# Patient Record
Sex: Female | Born: 1965 | Race: White | Hispanic: No | Marital: Married | Smoking: Former smoker
Health system: Southern US, Community
[De-identification: ages and names within clinical notes are randomized; demographics above are authoritative.]

## PROBLEM LIST (undated history)

## (undated) DIAGNOSIS — J45909 Unspecified asthma, uncomplicated: Secondary | ICD-10-CM

## (undated) DIAGNOSIS — I10 Essential (primary) hypertension: Secondary | ICD-10-CM

## (undated) HISTORY — PX: ABDOMINAL HYSTERECTOMY: SHX81

## (undated) HISTORY — PX: TUBAL LIGATION: SHX77

---

## 2016-05-09 ENCOUNTER — Other Ambulatory Visit: Payer: Self-pay | Admitting: Plastic Surgery

## 2016-05-10 ENCOUNTER — Other Ambulatory Visit (HOSPITAL_COMMUNITY): Payer: Self-pay | Admitting: Plastic Surgery

## 2016-05-10 DIAGNOSIS — S301XXA Contusion of abdominal wall, initial encounter: Secondary | ICD-10-CM

## 2016-05-10 DIAGNOSIS — S3011XA Contusion of abdominal wall, initial encounter: Secondary | ICD-10-CM

## 2016-05-11 ENCOUNTER — Other Ambulatory Visit: Payer: Self-pay | Admitting: Radiology

## 2016-05-12 ENCOUNTER — Ambulatory Visit (HOSPITAL_COMMUNITY)
Admission: RE | Admit: 2016-05-12 | Discharge: 2016-05-12 | Disposition: A | Discharge status: Home / Self Care | Payer: 59 | Source: Ambulatory Visit | Attending: Plastic Surgery | Admitting: Plastic Surgery

## 2016-05-12 ENCOUNTER — Other Ambulatory Visit (HOSPITAL_COMMUNITY): Payer: Self-pay | Admitting: Plastic Surgery

## 2016-05-12 ENCOUNTER — Encounter (HOSPITAL_COMMUNITY): Payer: Self-pay

## 2016-05-12 DIAGNOSIS — I10 Essential (primary) hypertension: Secondary | ICD-10-CM | POA: Diagnosis not present

## 2016-05-12 DIAGNOSIS — Z8249 Family history of ischemic heart disease and other diseases of the circulatory system: Secondary | ICD-10-CM | POA: Insufficient documentation

## 2016-05-12 DIAGNOSIS — S301XXA Contusion of abdominal wall, initial encounter: Secondary | ICD-10-CM

## 2016-05-12 DIAGNOSIS — Z87891 Personal history of nicotine dependence: Secondary | ICD-10-CM | POA: Diagnosis not present

## 2016-05-12 DIAGNOSIS — J45909 Unspecified asthma, uncomplicated: Secondary | ICD-10-CM | POA: Diagnosis not present

## 2016-05-12 DIAGNOSIS — L7634 Postprocedural seroma of skin and subcutaneous tissue following other procedure: Secondary | ICD-10-CM | POA: Insufficient documentation

## 2016-05-12 DIAGNOSIS — Y813 Surgical instruments, materials and general- and plastic-surgery devices (including sutures) associated with adverse incidents: Secondary | ICD-10-CM | POA: Diagnosis not present

## 2016-05-12 DIAGNOSIS — Y838 Other surgical procedures as the cause of abnormal reaction of the patient, or of later complication, without mention of misadventure at the time of the procedure: Secondary | ICD-10-CM | POA: Diagnosis not present

## 2016-05-12 HISTORY — DX: Essential (primary) hypertension: I10

## 2016-05-12 HISTORY — DX: Unspecified asthma, uncomplicated: J45.909

## 2016-05-12 LAB — CBC
HCT: 37.4 % (ref 36.0–46.0)
HEMOGLOBIN: 12 g/dL (ref 12.0–15.0)
MCH: 31.3 pg (ref 26.0–34.0)
MCHC: 32.1 g/dL (ref 30.0–36.0)
MCV: 97.4 fL (ref 78.0–100.0)
PLATELETS: 317 10*3/uL (ref 150–400)
RBC: 3.84 MIL/uL — ABNORMAL LOW (ref 3.87–5.11)
RDW: 13.2 % (ref 11.5–15.5)
WBC: 6.8 10*3/uL (ref 4.0–10.5)

## 2016-05-12 LAB — APTT: APTT: 29 s (ref 24–36)

## 2016-05-12 LAB — PROTIME-INR
INR: 0.99
PROTHROMBIN TIME: 13.1 s (ref 11.4–15.2)

## 2016-05-12 MED ORDER — LIDOCAINE HCL 1 % IJ SOLN
INTRAMUSCULAR | Status: AC
  Filled 2016-05-12: qty 20

## 2016-05-12 MED ORDER — FENTANYL CITRATE (PF) 100 MCG/2ML IJ SOLN
INTRAMUSCULAR | Status: AC
  Filled 2016-05-12: qty 2

## 2016-05-12 MED ORDER — MIDAZOLAM HCL 2 MG/2ML IJ SOLN
INTRAMUSCULAR | Status: AC
  Filled 2016-05-12: qty 2

## 2016-05-12 MED ORDER — MIDAZOLAM HCL 2 MG/2ML IJ SOLN
INTRAMUSCULAR | Status: AC | PRN
  Administered 2016-05-12: 1 mg via INTRAVENOUS

## 2016-05-12 MED ORDER — FENTANYL CITRATE (PF) 100 MCG/2ML IJ SOLN
INTRAMUSCULAR | Status: AC | PRN
  Administered 2016-05-12: 50 ug via INTRAVENOUS

## 2016-05-12 MED ORDER — CEFAZOLIN SODIUM-DEXTROSE 2-4 GM/100ML-% IV SOLN
2.0000 g | Freq: Once | INTRAVENOUS | Status: AC
  Administered 2016-05-12: 2 g via INTRAVENOUS
  Filled 2016-05-12: qty 100

## 2016-05-12 MED ORDER — CEFAZOLIN SODIUM-DEXTROSE 2-4 GM/100ML-% IV SOLN
INTRAVENOUS | Status: AC
  Filled 2016-05-12: qty 100

## 2016-05-12 MED ORDER — SODIUM CHLORIDE 0.9 % IV SOLN: INTRAVENOUS | Status: DC

## 2016-05-12 NOTE — Sedation Documentation (Signed)
Doctor at bedside explaining the delay to patient and family member at bedside.

## 2016-05-12 NOTE — Sedation Documentation (Signed)
Patient denies pain and is resting comfortably.  

## 2016-05-12 NOTE — H&P (Signed)
Chief Complaint: abdominal wall seroma  Referring Physician:Dr. Brantley Persons  Supervising Physician: Jolaine Click  Patient Status: [facility] - Out-pt  HPI: Tracy Saunders is an 51 y.o. female who underwent an abdominoplasty by the above referring physician 6 weeks ago.  She had 2 JP drains left in place.  They were removed about 2 weeks ago.  However, the patient states she has been developing seromas every since.  She states she is coming to the office every other day for aspirations.  Apparently, the physician then decided to request sclerotherapy for this seroma.  The patient presents today for this procedure.  Past Medical History:  Past Medical History:  Diagnosis Date  . Asthma   . Hypertension     Past Surgical History:  Past Surgical History:  Procedure Laterality Date  . ABDOMINAL HYSTERECTOMY    . TUBAL LIGATION      Family History:  Family History  Problem Relation Age of Onset  . Heart disease Mother   . Diabetes Father   . Alzheimer's disease Father   . Heart disease Father   . Breast cancer Sister     Social History:  reports that she quit smoking about 30 years ago. Her smoking use included Cigarettes. She has a 5.00 pack-year smoking history. She has never used smokeless tobacco. She reports that she drinks alcohol. She reports that she does not use drugs.  Allergies:  Allergies  Allergen Reactions  . Allegra Allergy [Fexofenadine Hcl] Shortness Of Breath  . Nifedipine Shortness Of Breath  . Lisinopril Cough    Medications: Medications reviewed in epic  Please HPI for pertinent positives, otherwise complete 10 system ROS negative.  Mallampati Score: MD Evaluation Airway: WNL Heart: WNL Abdomen: Other (comments) Abdomen comments: large transverse abdominal incision, healing well from recent abdominoplasty Chest/ Lungs: WNL ASA  Classification: 2 Mallampati/Airway Score: Two  Physical Exam: BP 126/79   Pulse 64   Temp 97.9 F (36.6 C)  (Oral)   Resp 16   Ht 5\' 8"  (1.727 m)   Wt 170 lb (77.1 kg)   SpO2 100%   BMI 25.85 kg/m  Body mass index is 25.85 kg/m. General: pleasant, WD, WN white female who is laying in bed in NAD HEENT: head is normocephalic, atraumatic.  Sclera are noninjected.  PERRL.  Ears and nose without any masses or lesions.  Mouth is pink and moist Heart: regular, rate, and rhythm.  Normal s1,s2. No obvious murmurs, gallops, or rubs noted.  Palpable radial and pedal pulses bilaterally Lungs: CTAB, no wheezes, rhonchi, or rales noted.  Respiratory effort nonlabored Abd: soft, NT, ND, +BS, no masses.  Transverse abdominal scar from abdominoplasty noted.  Some induration noted all along the scar, not definitely fluctuants noted.  No evidence of erythema or infection. Psych: A&Ox3 with an appropriate affect.   Labs: Results for orders placed or performed during the hospital encounter of 05/12/16 (from the past 48 hour(s))  APTT upon arrival     Status: None   Collection Time: 05/12/16  7:42 AM  Result Value Ref Range   aPTT 29 24 - 36 seconds  CBC upon arrival     Status: Abnormal   Collection Time: 05/12/16  7:42 AM  Result Value Ref Range   WBC 6.8 4.0 - 10.5 K/uL   RBC 3.84 (L) 3.87 - 5.11 MIL/uL   Hemoglobin 12.0 12.0 - 15.0 g/dL   HCT 16.1 09.6 - 04.5 %   MCV 97.4 78.0 - 100.0 fL  MCH 31.3 26.0 - 34.0 pg   MCHC 32.1 30.0 - 36.0 g/dL   RDW 96.013.2 45.411.5 - 09.815.5 %   Platelets 317 150 - 400 K/uL  Protime-INR upon arrival     Status: None   Collection Time: 05/12/16  7:42 AM  Result Value Ref Range   Prothrombin Time 13.1 11.4 - 15.2 seconds   INR 0.99     Imaging: No results found.  Assessment/Plan 1. S/p abdominoplasty with recurrent abdominal wall seroma -unfortunately, there is no imaging for the patient and protocol for this situation prior to sclerotherapy, due to risks, is to proceed first with imaging and possible drain placement and management for at least a week.  If this fails, then  consideration for sclerotherapy is appropriate. -there was initially a call to set up a clinic visit to discuss this prior to today but that was refused.  So now today, Dr. Bonnielee HaffHoss is going to try and contact the ordering provider to discuss a plan, which will likely be a CT scan and if the fluid collection is large enough, then he will place a drain. -cementing of the plan will be made after his discussion with Dr. Sherald Hessontogiannis.  Thank you for this interesting consult.  I greatly enjoyed meeting Tracy Saunders and look forward to participating in their care.  A copy of this report was sent to the requesting provider on this date.  Electronically Signed: Letha CapeSBORNE,Murlene Revell E 05/12/2016, 9:05 AM   I spent a total of  40 Minutes   in face to face in clinical consultation, greater than 50% of which was counseling/coordinating care for abdominal wall seroma

## 2016-05-12 NOTE — Sedation Documentation (Signed)
Doctor at bedside talking to patient and family member regarding delay.

## 2016-05-12 NOTE — Sedation Documentation (Signed)
In CT waiting for Doctor.

## 2016-05-12 NOTE — Procedures (Signed)
10 Fr abd seroma drain No comp/EBL

## 2016-05-12 NOTE — Sedation Documentation (Signed)
Patient is resting comfortably. 

## 2016-05-12 NOTE — Discharge Instructions (Signed)
Surgical [facility] Introduction Surgical drains are used to remove extra fluid that normally builds up in a surgical wound after surgery. A surgical drain helps to heal a surgical wound. Different kinds of surgical drains include:  Active drains. These drains use suction to pull drainage away from the surgical wound. Drainage flows through a tube to a container outside of the body. It is important to keep the bulb or the drainage container flat (compressed) at all times, except while you empty it. Flattening the bulb or container creates suction. The two most common types of active drains are bulb drains and Hemovac drains.  Passive drains. These drains allow fluid to drain naturally, by gravity. Drainage flows through a tube to a bandage (dressing) or a container outside of the body. Passive drains do not need to be emptied. The most common type of passive drain is the Penrose drain. A drain is placed during surgery. Immediately after surgery, drainage is usually bright red and a little thicker than water. The drainage may gradually turn yellow or pink and become thinner. It is likely that your health care provider will remove the drain when the drainage stops or when the amount decreases to 1-2 Tbsp (15-30 mL) during a 24-hour period. How to care for your surgical drain  Keep the skin around the drain dry and covered with a dressing at all times.  Check your drain area every day for signs of infection. Check for:  More redness, swelling, or pain.  Pus or a bad smell.  Cloudy drainage. Follow instructions from your health care provider about how to take care of your drain and how to change your dressing. Change your dressing at least one time every day. Change it more often if needed to keep the dressing dry. Make sure you: 1. Gather your supplies, including:  Tape.  Germ-free cleaning solution (sterile saline).  Split gauze drain sponge: 4 x 4 inches (10 x 10 cm).  Gauze square: 4  x 4 inches (10 x 10 cm). 2. Wash your hands with soap and water before you change your dressing. If soap and water are not available, use hand sanitizer. 3. Remove the old dressing. Avoid using scissors to do that. 4. Use sterile saline to clean your skin around the drain. 5. Place the tube through the slit in a drain sponge. Place the drain sponge so that it covers your wound. 6. Place the gauze square or another drain sponge on top of the drain sponge that is on the wound. Make sure the tube is between those layers. 7. Tape the dressing to your skin. 8. If you have an active bulb or Hemovac drain, tape the drainage tube to your skin 1-2 inches (2.5-5 cm) below the place where the tube enters your body. Taping keeps the tube from pulling on any stitches (sutures) that you have. 9. Wash your hands with soap and water. 10. Write down the color of your drainage and how often you change your dressing. How to empty your active bulb or Hemovac drain 1. Make sure that you have a measuring cup that you can empty your drainage into. 2. Wash your hands with soap and water. If soap and water are not available, use hand sanitizer. 3. Gently move your fingers down the tube while squeezing very lightly. This is called stripping the tube. This clears any drainage, clots, or tissue from the tube.  Do not pull on the tube.  You may need to strip the  tube several times every day to keep the tube clear. 4. Open the bulb cap or the drain plug. Do not touch the inside of the cap or the bottom of the plug. 5. Empty all of the drainage into the measuring cup. 6. Compress the bulb or the container and replace the cap or the plug. To compress the bulb or the container, squeeze it firmly in the middle while you close the cap or plug the container. 7. Write down the amount of drainage that you have in each 24-hour period. If you have less than 2 Tbsp (30 mL) of drainage during 24 hours, contact your health care  provider. 8. Flush the drainage down the toilet. 9. Wash your hands with soap and water. Contact a health care provider if:  You have more redness, swelling, or pain around your drain area.  The amount of drainage that you have is increasing instead of decreasing.  You have pus or a bad smell coming from your drain area.  You have a fever.  You have drainage that is cloudy.  There is a sudden stop or a sudden decrease in the amount of drainage that you have.  Your tube falls out.  Your active draindoes not stay compressedafter you empty it. This information is not intended to replace advice given to you by your health care provider. Make sure you discuss any questions you have with your health care provider. Document Released: 03/10/2000 Document Revised: 08/19/2015 Document Reviewed: 09/30/2014  2017 Elsevier

## 2016-05-12 NOTE — Sedation Documentation (Signed)
Doctor arrived in CT

## 2016-05-15 ENCOUNTER — Other Ambulatory Visit: Payer: 59

## 2016-05-15 ENCOUNTER — Other Ambulatory Visit (HOSPITAL_COMMUNITY): Payer: Self-pay | Admitting: Interventional Radiology

## 2016-05-15 DIAGNOSIS — S301XXA Contusion of abdominal wall, initial encounter: Secondary | ICD-10-CM

## 2016-05-17 LAB — AEROBIC/ANAEROBIC CULTURE (SURGICAL/DEEP WOUND)

## 2016-05-17 LAB — AEROBIC/ANAEROBIC CULTURE W GRAM STAIN (SURGICAL/DEEP WOUND): Culture: NO GROWTH

## 2016-05-18 ENCOUNTER — Encounter: Payer: Self-pay | Admitting: Radiology

## 2016-05-18 ENCOUNTER — Other Ambulatory Visit (HOSPITAL_COMMUNITY): Payer: Self-pay | Admitting: Interventional Radiology

## 2016-05-18 ENCOUNTER — Other Ambulatory Visit: Payer: Self-pay | Admitting: Radiology

## 2016-05-18 ENCOUNTER — Ambulatory Visit
Admission: RE | Admit: 2016-05-18 | Discharge: 2016-05-18 | Disposition: A | Discharge status: Home / Self Care | Payer: 59 | Source: Ambulatory Visit | Attending: Interventional Radiology | Admitting: Interventional Radiology

## 2016-05-18 DIAGNOSIS — S301XXA Contusion of abdominal wall, initial encounter: Secondary | ICD-10-CM

## 2016-05-18 HISTORY — PX: IR GENERIC HISTORICAL: IMG1180011

## 2016-05-18 NOTE — Progress Notes (Signed)
Chief Complaint: Patient was seen in consultation today for  Chief Complaint  Patient presents with  . Follow-up    Follow up seroma drain   at the request of Keshana Klemz  Referring Physician(s): Farra Nikolic  History of Present Illness: Tracy Saunders is a 51 y.o. female who has an abdominal wall seroma after abdominoplasty. She underwent percutaneous drainage and is in follow-up for reevaluation. She has been recording the outputs at 30 mL per day. She has not been flushing the drain at all. Output is clear and serous. She denies fevers or chills. She has continued to wear her binder.  Past Medical History:  Diagnosis Date  . Asthma   . Hypertension     Past Surgical History:  Procedure Laterality Date  . ABDOMINAL HYSTERECTOMY    . TUBAL LIGATION      Allergies: Allegra allergy [fexofenadine hcl]; Nifedipine; and Lisinopril  Medications: Prior to Admission medications   Medication Sig Start Date End Date Taking? Authorizing Provider  cetirizine (ZYRTEC) 10 MG tablet Take 10 mg by mouth 2 (two) times daily.    Historical Provider, MD  citalopram (CELEXA) 20 MG tablet Take 20 mg by mouth 2 (two) times daily at 10 AM and 5 PM.    Historical Provider, MD  losartan (COZAAR) 50 MG tablet Take 50 mg by mouth daily.    Historical Provider, MD  montelukast (SINGULAIR) 10 MG tablet Take 10 mg by mouth at bedtime.    Historical Provider, MD     Family History  Problem Relation Age of Onset  . Heart disease Mother   . Diabetes Father   . Alzheimer's disease Father   . Heart disease Father   . Breast cancer Sister     Social History   Social History  . Marital status: Married    Spouse name: N/A  . Number of children: N/A  . Years of education: N/A   Social History Main Topics  . Smoking status: Former Smoker    Packs/day: 0.50    Years: 10.00    Types: Cigarettes    Quit date: 1988  . Smokeless tobacco: Never Used  . Alcohol use Yes     Comment: social  .  Drug use: No  . Sexual activity: Not on file   Other Topics Concern  . Not on file   Social History Narrative  . No narrative on file      Review of Systems: A 12 point ROS discussed and pertinent positives are indicated in the HPI above.  All other systems are negative.  Review of Systems  Vital Signs: BP 129/85 (BP Location: Left Arm, Patient Position: Sitting, Cuff Size: Normal)   Pulse 69   Temp 98 F (36.7 C) (Oral)   Resp 14   Ht 5\' 8"  (1.727 m)   Wt 170 lb (77.1 kg)   SpO2 100%   BMI 25.85 kg/m   Physical Exam  Constitutional: She is oriented to person, place, and time.  HENT:  Head: Normocephalic and atraumatic.  Abdominal:  The lower abdominal drain site is clean and dry. The JP bulb contains 30 mL  Musculoskeletal: Normal range of motion.  Neurological: She is alert and oriented to person, place, and time.  Skin: Skin is warm and dry.    Mallampati Score:     Imaging: Ct Image Guided Drainage Percut Cath  Peritoneal Retroperit  Result Date: 05/12/2016 INDICATION: Anterior abdominal wall seroma. This has been aspirated innumerable times by the plastic  Careers adviser. Drainage and possible sclerotherapy is requested. EXAM: CT CORE BIOPSY RENAL MEDICATIONS: The patient is currently admitted to the hospital and receiving intravenous antibiotics. The antibiotics were administered within an appropriate time frame prior to the initiation of the procedure. ANESTHESIA/SEDATION: Fentanyl 50 mcg IV; Versed 1 mg IV Moderate Sedation Time:  13 The patient was continuously monitored during the procedure by the interventional radiology nurse under my direct supervision. COMPLICATIONS: None immediate. PROCEDURE: Informed written consent was obtained from the patient after a thorough discussion of the procedural risks, benefits and alternatives. All questions were addressed. Maximal Sterile Barrier Technique was utilized including caps, mask, sterile gowns, sterile gloves, sterile  drape, hand hygiene and skin antiseptic. A timeout was performed prior to the initiation of the procedure. The right lower abdomen was prepped and draped in a sterile fashion. 1% lidocaine was utilized for local anesthesia. Ancef was given preoperatively. Under CT guidance, an 18 gauge needle was inserted into the seroma and removed over an Amplatz wire. A 10 French dilator followed by a 10 [place] drain were inserted. 30 cc serosanguineous fluid was aspirated. FINDINGS: Initial imaging demonstrates a very thin fluid collection between the anterior abdominal wall and subcutaneous fat of the lower abdomen below the umbilicus. Subsequent imaging demonstrates placement of a 10 French drain into the fluid collection. After drain placement, there was resolution of the fluid collection. It was sent for culture. IMPRESSION: The patient has a anterior abdominal wall seroma that has been aspirated multiple times. A drain was placed and will be removed next week. If the fluid collection does not recur, no further treatment is necessary. If the fluid collection recurrs, sclerotherapy can be considered. The patient will need follow-up next week for drain removal and a subsequent clinic visit in 2 subsequent weeks with ultrasound to evaluate for recurrence of the fluid collection. Electronically Signed   By: Jolaine Click M.D.   On: 05/12/2016 16:48    Labs:  CBC:  Recent Labs  05/12/16 0742  WBC 6.8  HGB 12.0  HCT 37.4  PLT 317    COAGS:  Recent Labs  05/12/16 0742  INR 0.99  APTT 29    BMP: No results for input(s): NA, K, CL, CO2, GLUCOSE, BUN, CALCIUM, CREATININE, GFRNONAA, GFRAA in the last 8760 hours.  Invalid input(s): CMP  LIVER FUNCTION TESTS: No results for input(s): BILITOT, AST, ALT, ALKPHOS, PROT, ALBUMIN in the last 8760 hours.  TUMOR MARKERS: No results for input(s): AFPTM, CEA, CA199, CHROMGRNA in the last 8760 hours.  Assessment and Plan:  After drainage of the seroma, serous  output has continued, therefore she has failed conservative management of the seroma. She will require alcohol sclerotherapy for definitive therapy. This will be scheduled ASAP. After sclerotherapy, the drain will remain in place and she will follow-up 1 week subsequently with [place] at the office. Sclerotherapy can be repeated until output diminishes  Thank you for this interesting consult.  I greatly enjoyed meeting Tracy Saunders and look forward to participating in their care.  A copy of this report was sent to the requesting provider on this date.  Electronically Signed: Kaycen Whitworth, ART A 05/18/2016, 11:41 AM   I spent a total of   10 Minutes in face to face in clinical consultation, greater than 50% of which was counseling/coordinating care for abdominal wall seroma and sclerotherapy. Patient ID: Tracy Saunders, female   DOB: 1965-07-16, 51 y.o.   MRN: [id number]

## 2016-05-19 ENCOUNTER — Encounter (HOSPITAL_COMMUNITY): Payer: Self-pay

## 2016-05-19 ENCOUNTER — Ambulatory Visit (HOSPITAL_COMMUNITY)
Admission: RE | Admit: 2016-05-19 | Discharge: 2016-05-19 | Disposition: A | Discharge status: Home / Self Care | Payer: 59 | Source: Ambulatory Visit | Attending: Interventional Radiology | Admitting: Interventional Radiology

## 2016-05-19 DIAGNOSIS — Z87891 Personal history of nicotine dependence: Secondary | ICD-10-CM | POA: Diagnosis not present

## 2016-05-19 DIAGNOSIS — Y839 Surgical procedure, unspecified as the cause of abnormal reaction of the patient, or of later complication, without mention of misadventure at the time of the procedure: Secondary | ICD-10-CM | POA: Insufficient documentation

## 2016-05-19 DIAGNOSIS — Z8249 Family history of ischemic heart disease and other diseases of the circulatory system: Secondary | ICD-10-CM | POA: Insufficient documentation

## 2016-05-19 DIAGNOSIS — J45909 Unspecified asthma, uncomplicated: Secondary | ICD-10-CM | POA: Diagnosis not present

## 2016-05-19 DIAGNOSIS — I10 Essential (primary) hypertension: Secondary | ICD-10-CM | POA: Diagnosis not present

## 2016-05-19 DIAGNOSIS — Y829 Unspecified medical devices associated with adverse incidents: Secondary | ICD-10-CM | POA: Diagnosis not present

## 2016-05-19 DIAGNOSIS — L7634 Postprocedural seroma of skin and subcutaneous tissue following other procedure: Secondary | ICD-10-CM | POA: Diagnosis present

## 2016-05-19 DIAGNOSIS — S301XXA Contusion of abdominal wall, initial encounter: Secondary | ICD-10-CM

## 2016-05-19 HISTORY — PX: IR GENERIC HISTORICAL: IMG1180011

## 2016-05-19 MED ORDER — ALCOHOL 98 % IV SOLN
30.0000 mL | Freq: Once | INTRAVENOUS | Status: DC
  Administered 2016-06-08: 20 mL
  Filled 2016-05-19: qty 30

## 2016-05-19 NOTE — H&P (Signed)
Chief Complaint: Patient was seen in consultation today for sclerotherapy of persistent abdominal seroma at the request of Dr Delcie RochM Contogiannis  Referring Physician(s): Dr Delcie RochM Contogiannis  Supervising Physician: Ruel FavorsShick, Trevor  [facility]  History of Present Illness: Tracy Saunders is a 51 y.o. female   Abdominal surgery Mar 31, 2016 Developed seroma that has been aspirated more than once. Most recently a drain was placed by Dr Bonnielee HaffHoss 2/16 Re seen in clinic 2/22 with Dr Bonnielee HaffHoss: After drainage of the seroma, serous output has continued, therefore she has failed conservative management of the seroma. She will require alcohol sclerotherapy for definitive therapy. This will be scheduled ASAP. After sclerotherapy, the drain will remain in place and she will follow-up 1 week subsequently with us at the office. Sclerotherapy can be repeated until output diminishes.  Now scheduled for procedure  Past Medical History:  Diagnosis Date  . Asthma   . Hypertension     Past Surgical History:  Procedure Laterality Date  . ABDOMINAL HYSTERECTOMY    . IR GENERIC HISTORICAL  05/18/2016   IR RADIOLOGIST EVAL & MGMT 05/18/2016 Jolaine ClickArthur Hoss, MD [facility] INTERV RAD  . TUBAL LIGATION      Allergies: Allegra allergy [fexofenadine hcl]; Nifedipine; Other; and Lisinopril  Medications: Prior to Admission medications   Medication Sig Start Date End Date Taking? Authorizing Provider  cetirizine (ZYRTEC) 10 MG tablet Take 10 mg by mouth 2 (two) times daily.   Yes Historical Provider, MD  citalopram (CELEXA) 20 MG tablet Take 20 mg by mouth 2 (two) times daily at 10 AM and 5 PM.   Yes Historical Provider, MD  losartan (COZAAR) 50 MG tablet Take 50 mg by mouth daily.   Yes Historical Provider, MD  montelukast (SINGULAIR) 10 MG tablet Take 10 mg by mouth at bedtime.   Yes Historical Provider, MD  Multiple Vitamin (MULTIVITAMIN WITH MINERALS) TABS tablet Take 1 tablet by mouth daily.   Yes  Historical Provider, MD     Family History  Problem Relation Age of Onset  . Heart disease Mother   . Diabetes Father   . Alzheimer's disease Father   . Heart disease Father   . Breast cancer Sister     Social History   Social History  . Marital status: Married    Spouse name: N/A  . Number of children: N/A  . Years of education: N/A   Social History Main Topics  . Smoking status: Former Smoker    Packs/day: 0.50    Years: 10.00    Types: Cigarettes    Quit date: 1988  . Smokeless tobacco: Never Used  . Alcohol use Yes     Comment: social  . Drug use: No  . Sexual activity: Not Asked   Other Topics Concern  . None   Social History Narrative  . None    Review of Systems: A 12 point ROS discussed and pertinent positives are indicated in the HPI above.  All other systems are negative.  Review of Systems  Constitutional: Negative for activity change, appetite change and fatigue.  Gastrointestinal: Positive for abdominal pain.  Psychiatric/Behavioral: Negative for behavioral problems and confusion.    Vital Signs: There were no vitals taken for this visit.  Physical Exam  Constitutional: She is oriented to person, place, and time. She appears well-nourished.  Cardiovascular: Normal rate, regular rhythm and normal heart sounds.   Pulmonary/Chest: Effort normal and breath sounds normal.  Abdominal: Soft. Bowel sounds are  normal.  Abdominal seroma Drain intact  Musculoskeletal: Normal range of motion.  Neurological: She is alert and oriented to person, place, and time.  Skin: Skin is warm and dry.  Psychiatric: She has a normal mood and affect. Her behavior is normal. Judgment and thought content normal.  Nursing note and vitals reviewed.   Mallampati Score:  MD Evaluation Airway: WNL Heart: WNL Abdomen: WNL Chest/ Lungs: WNL ASA  Classification: 2 Mallampati/Airway Score: One  Imaging: Ct Image Guided Drainage Percut Cath  Peritoneal  Retroperit  Result Date: 05/12/2016 INDICATION: Anterior abdominal wall seroma. This has been aspirated innumerable times by the plastic surgeon. Drainage and possible sclerotherapy is requested. EXAM: CT CORE BIOPSY RENAL MEDICATIONS: The patient is currently admitted to the hospital and receiving intravenous antibiotics. The antibiotics were administered within an appropriate time frame prior to the initiation of the procedure. ANESTHESIA/SEDATION: Fentanyl 50 mcg IV; Versed 1 mg IV Moderate Sedation Time:  13 The patient was continuously monitored during the procedure by the interventional radiology nurse under my direct supervision. COMPLICATIONS: None immediate. PROCEDURE: Informed written consent was obtained from the patient after a thorough discussion of the procedural risks, benefits and alternatives. All questions were addressed. Maximal Sterile Barrier Technique was utilized including caps, mask, sterile gowns, sterile gloves, sterile drape, hand hygiene and skin antiseptic. A timeout was performed prior to the initiation of the procedure. The right lower abdomen was prepped and draped in a sterile fashion. 1% lidocaine was utilized for local anesthesia. Ancef was given preoperatively. Under CT guidance, an 18 gauge needle was inserted into the seroma and removed over an Amplatz wire. A 10 French dilator followed by a 10 [place] drain were inserted. 30 cc serosanguineous fluid was aspirated. FINDINGS: Initial imaging demonstrates a very thin fluid collection between the anterior abdominal wall and subcutaneous fat of the lower abdomen below the umbilicus. Subsequent imaging demonstrates placement of a 10 French drain into the fluid collection. After drain placement, there was resolution of the fluid collection. It was sent for culture. IMPRESSION: The patient has a anterior abdominal wall seroma that has been aspirated multiple times. A drain was placed and will be removed next week. If the fluid  collection does not recur, no further treatment is necessary. If the fluid collection recurrs, sclerotherapy can be considered. The patient will need follow-up next week for drain removal and a subsequent clinic visit in 2 subsequent weeks with ultrasound to evaluate for recurrence of the fluid collection. Electronically Signed   By: Jolaine Click M.D.   On: 05/12/2016 16:48   Ir Radiologist Eval & Mgmt  Result Date: 05/18/2016 Please refer to "Notes" to see consult details.   Labs:  CBC:  Recent Labs  05/12/16 0742  WBC 6.8  HGB 12.0  HCT 37.4  PLT 317    COAGS:  Recent Labs  05/12/16 0742  INR 0.99  APTT 29    BMP: No results for input(s): NA, K, CL, CO2, GLUCOSE, BUN, CALCIUM, CREATININE, GFRNONAA, GFRAA in the last 8760 hours.  Invalid input(s): CMP  LIVER FUNCTION TESTS: No results for input(s): BILITOT, AST, ALT, ALKPHOS, PROT, ALBUMIN in the last 8760 hours.  TUMOR MARKERS: No results for input(s): AFPTM, CEA, CA199, CHROMGRNA in the last 8760 hours.  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  I greatly enjoyed meeting Jazmine Heckman and look forward to participating in their care.  A copy of this report was sent to the requesting provider on this date.  Electronically Signed: Ralene Muskrat A 05/19/2016, 11:12 AM   I spent a total of  30 Minutes   in face to face in clinical consultation, greater than 50% of which was counseling/coordinating care for sclerotherapy of abdominal seroma

## 2016-05-23 ENCOUNTER — Other Ambulatory Visit (HOSPITAL_COMMUNITY): Payer: Self-pay | Admitting: Interventional Radiology

## 2016-05-23 DIAGNOSIS — S301XXA Contusion of abdominal wall, initial encounter: Secondary | ICD-10-CM

## 2016-05-25 ENCOUNTER — Ambulatory Visit
Admission: RE | Admit: 2016-05-25 | Discharge: 2016-05-25 | Disposition: A | Discharge status: Home / Self Care | Payer: 59 | Source: Ambulatory Visit | Attending: Interventional Radiology | Admitting: Interventional Radiology

## 2016-05-25 DIAGNOSIS — S301XXA Contusion of abdominal wall, initial encounter: Secondary | ICD-10-CM

## 2016-05-25 NOTE — Progress Notes (Signed)
Chief Complaint: Persistent abdominal seroma  Referring Physician(s): Dr. Delcie RochM. Contogiannis  Supervising Physician: Ruel FavorsShick, Trevor  History of Present Illness: Tracy Saunders is a 51 y.o. female who underwent an abdominoplasty by Dr. Sherald Hessontogiannis on 03/31/2016.  Unfortunately she developed a seroma which was aspirated a couple of times and did not resolve.  She required drain placement by Dr. Bonnielee HaffHoss on 05/12/2016  She continued to have copious output from the drain so alcohol sclerotherapy was done on 05/19/2016 for definitive therapy.  She is here today for follow up.  The drainage has diminished.   Ultrasound shows no residual fluid accumulation.  Past Medical History:  Diagnosis Date  . Asthma   . Hypertension     Past Surgical History:  Procedure Laterality Date  . ABDOMINAL HYSTERECTOMY    . IR GENERIC HISTORICAL  05/18/2016   IR RADIOLOGIST EVAL & MGMT 05/18/2016 Jolaine ClickArthur Hoss, MD [facility] INTERV RAD  . IR GENERIC HISTORICAL  05/19/2016   IR SCLEROTHERAPY OF A FLUID COLLECTION 05/19/2016 Berdine DanceMichael Shick, MD [facility]INTER[facility] RAD  . TUBAL LIGATION      Allergies: Allegra allergy [fexofenadine hcl]; Nifedipine; Other; and Lisinopril  Medications: Prior to Admission medications   Medication Sig Start Date End Date Taking? Authorizing Provider  cetirizine (ZYRTEC) 10 MG tablet Take 10 mg by mouth 2 (two) times daily.    Historical Provider, MD  citalopram (CELEXA) 20 MG tablet Take 20 mg by mouth 2 (two) times daily at 10 AM and 5 PM.    Historical Provider, MD  losartan (COZAAR) 50 MG tablet Take 50 mg by mouth daily.    Historical Provider, MD  montelukast (SINGULAIR) 10 MG tablet Take 10 mg by mouth at bedtime.    Historical Provider, MD  Multiple Vitamin (MULTIVITAMIN WITH MINERALS) TABS tablet Take 1 tablet by mouth daily.    Historical Provider, MD     Family History  Problem Relation Age of Onset  . Heart disease Mother   . Diabetes Father   . Alzheimer's disease Father    . Heart disease Father   . Breast cancer Sister     Social History   Social History  . Marital status: Married    Spouse name: N/A  . Number of children: N/A  . Years of education: N/A   Social History Main Topics  . Smoking status: Former Smoker    Packs/day: 0.50    Years: 10.00    Types: Cigarettes    Quit date: 1988  . Smokeless tobacco: Never Used  . Alcohol use Yes     Comment: social  . Drug use: No  . Sexual activity: Not on file   Other Topics Concern  . Not on file   Social History Narrative  . No narrative on file    Review of Systems  Vital Signs: There were no vitals taken for this visit.  Physical Exam Awake and alert Healing abdominoplasty incision Drain in place, intact Removed easily without complication, patient tolerated well. Dressing placed  Imaging: Ir Sclerotherapy Of A Fluid Collection  Result Date: 05/19/2016 INDICATION: Postop abdominal wall seroma, status post percutaneous drainage. EXAM: IR SCLEROTHERAPY OF FLUID COLLECTION MEDICATIONS: The patient is currently admitted to the hospital and receiving intravenous antibiotics. The antibiotics were administered within an appropriate time frame prior to the initiation of the procedure. ANESTHESIA/SEDATION: None. The patient was continuously monitored during the procedure by the interventional radiology nurse under my direct supervision. COMPLICATIONS: None immediate. PROCEDURE: Informed written consent was obtained  from the patient after a thorough discussion of the procedural risks, benefits and alternatives. All questions were addressed. Maximal Sterile Barrier Technique was utilized including caps, mask, sterile gowns, sterile gloves, sterile drape, hand hygiene and skin antiseptic. A timeout was performed prior to the initiation of the procedure. Patient is status post percutaneous drainage of an anterior lower abdominal wall seroma. Persistent drain output of approximately 30 cc  serosanguineous fluid daily. She returns for catheter sclerotherapy. Alcohol sclerotherapy: Under sterile conditions, the existing anterior lower abdominal drain catheter was injected with 20 cc 100% absolute alcohol for sclerotherapy. Patient was placed supine, left lateral decubitus, right lateral decubitus, and prone for 15 minutes the each, total sclerotherapy time 1 hour. Following this, the alcohol was aspirated and drain catheter was reconnected to a suction bulb. No immediate complication. Patient tolerated the procedure well. IMPRESSION: Successful abdominal wall seroma alcohol sclerotherapy as above. PLAN: Outpatient follow-up to reassess catheter output in 1 week. Electronically Signed   By: Judie Petit.  Shick M.D.   On: 05/19/2016 13:21   Ct Image Guided Drainage Percut Cath  Peritoneal Retroperit  Result Date: 05/12/2016 INDICATION: Anterior abdominal wall seroma. This has been aspirated innumerable times by the plastic surgeon. Drainage and possible sclerotherapy is requested. EXAM: CT CORE BIOPSY RENAL MEDICATIONS: The patient is currently admitted to the hospital and receiving intravenous antibiotics. The antibiotics were administered within an appropriate time frame prior to the initiation of the procedure. ANESTHESIA/SEDATION: Fentanyl 50 mcg IV; Versed 1 mg IV Moderate Sedation Time:  13 The patient was continuously monitored during the procedure by the interventional radiology nurse under my direct supervision. COMPLICATIONS: None immediate. PROCEDURE: Informed written consent was obtained from the patient after a thorough discussion of the procedural risks, benefits and alternatives. All questions were addressed. Maximal Sterile Barrier Technique was utilized including caps, mask, sterile gowns, sterile gloves, sterile drape, hand hygiene and skin antiseptic. A timeout was performed prior to the initiation of the procedure. The right lower abdomen was prepped and draped in a sterile fashion. 1%  lidocaine was utilized for local anesthesia. Ancef was given preoperatively. Under CT guidance, an 18 gauge needle was inserted into the seroma and removed over an Amplatz wire. A 10 French dilator followed by a 10 [place] drain were inserted. 30 cc serosanguineous fluid was aspirated. FINDINGS: Initial imaging demonstrates a very thin fluid collection between the anterior abdominal wall and subcutaneous fat of the lower abdomen below the umbilicus. Subsequent imaging demonstrates placement of a 10 French drain into the fluid collection. After drain placement, there was resolution of the fluid collection. It was sent for culture. IMPRESSION: The patient has a anterior abdominal wall seroma that has been aspirated multiple times. A drain was placed and will be removed next week. If the fluid collection does not recur, no further treatment is necessary. If the fluid collection recurrs, sclerotherapy can be considered. The patient will need follow-up next week for drain removal and a subsequent clinic visit in 2 subsequent weeks with ultrasound to evaluate for recurrence of the fluid collection. Electronically Signed   By: Jolaine Click M.D.   On: 05/12/2016 16:48   Ir Radiologist Eval & Mgmt  Result Date: 05/18/2016 Please refer to "Notes" to see consult details.   Labs:  CBC:  Recent Labs  05/12/16 0742  WBC 6.8  HGB 12.0  HCT 37.4  PLT 317    COAGS:  Recent Labs  05/12/16 0742  INR 0.99  APTT 29    BMP:  No results for input(s): NA, K, CL, CO2, GLUCOSE, BUN, CALCIUM, CREATININE, GFRNONAA, GFRAA in the last 8760 hours.  Invalid input(s): CMP  LIVER FUNCTION TESTS: No results for input(s): BILITOT, AST, ALT, ALKPHOS, PROT, ALBUMIN in the last 8760 hours.  TUMOR MARKERS: No results for input(s): AFPTM, CEA, CA199, CHROMGRNA in the last 8760 hours.  Assessment:  S/P Abdominoplasty 03/31/2016  Persistent abdominal seroma  S/P drain placement on 2/16---failed conservative drain  management  Sclerotherapy of seroma by Dr. Bonnielee Haff on 05/19/2016  Seroma seems to be resolved = removal of drain today and dry dressing placed.  F/U PRN.  Electronically Signed: Gwynneth Macleod PA-C 05/25/2016, 11:55 AM   Please refer to Dr. Chester Holstein attestation of this note for management and plan.

## 2016-05-30 ENCOUNTER — Other Ambulatory Visit: Payer: 59

## 2016-06-08 DIAGNOSIS — L7634 Postprocedural seroma of skin and subcutaneous tissue following other procedure: Secondary | ICD-10-CM | POA: Diagnosis not present

## 2017-04-25 IMAGING — CT CT CORE BIOPSY RENAL
1 of 3 series · 12 of 32 positions shown, 18 images · non-contrast
Comparison: none

INDICATION: Anterior abdominal wall seroma. This has been aspirated innumerable
times by the plastic surgeon. Drainage and possible sclerotherapy is
requested.

[Series 3: i-spiral 5.0 b40f · axial · 0.96mm/px · z∈[+697,+837]mm · 12 of 48 slices shown, 18 images]
[im 4/48  soft-tissue]
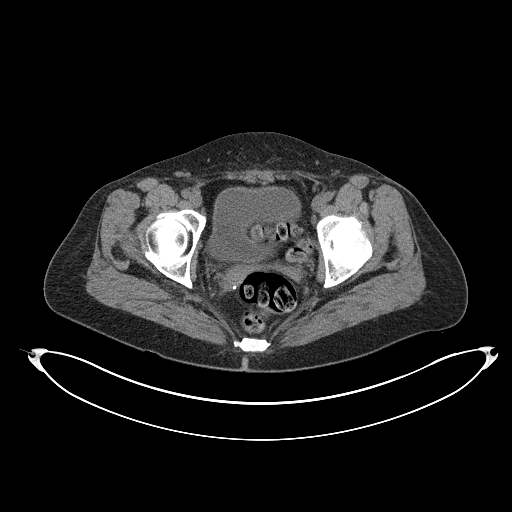
[im 4/48  bone]
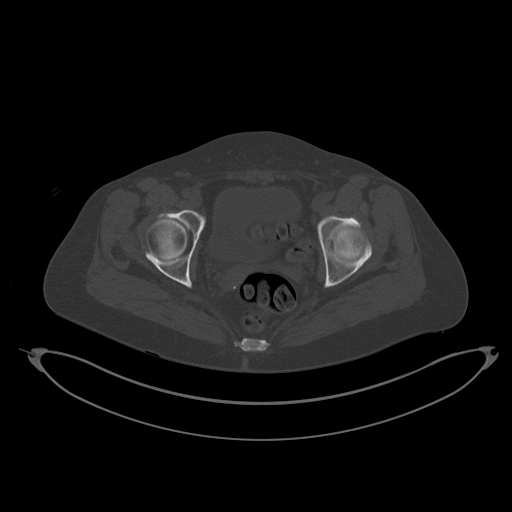
[im 8/48  soft-tissue]
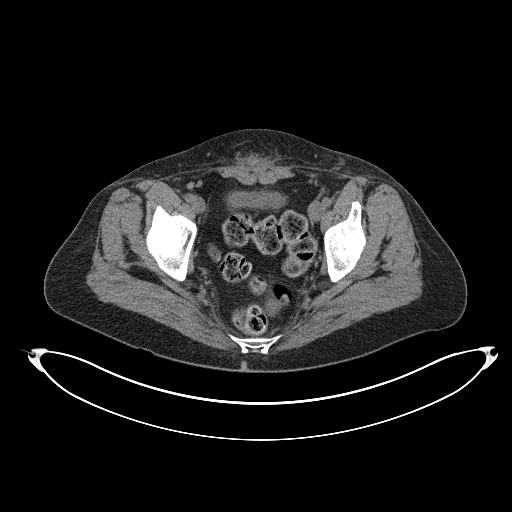
[im 11/48  soft-tissue]
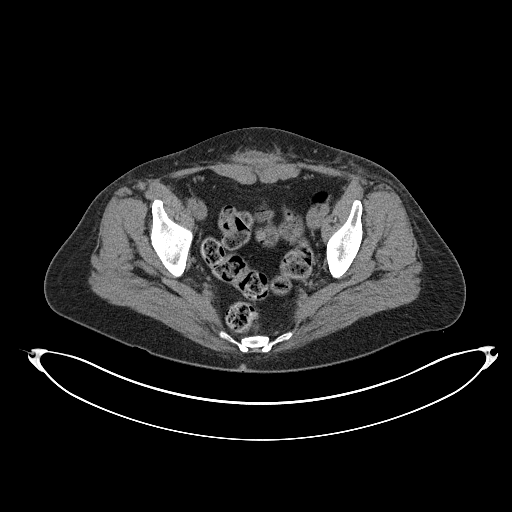
[im 15/48  soft-tissue]
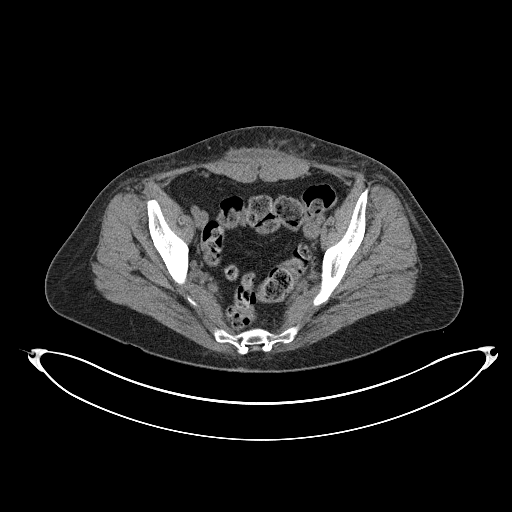
[im 19/48  soft-tissue]
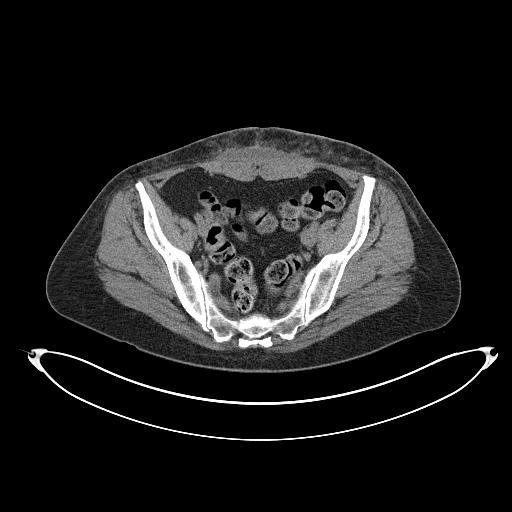
[im 22/48  soft-tissue]
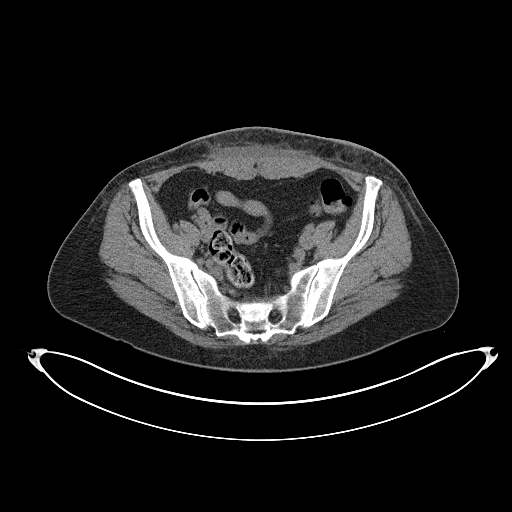
[im 26/48  soft-tissue]
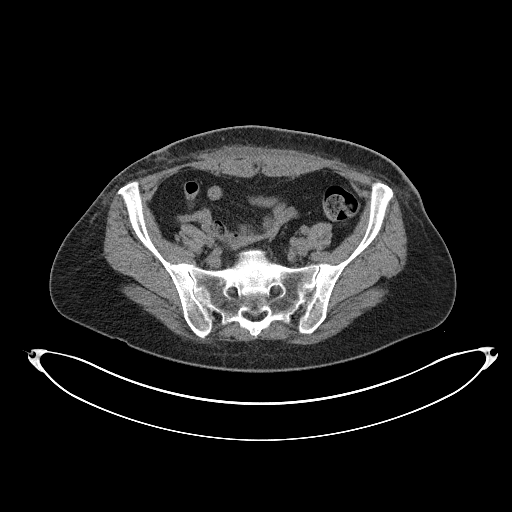
[im 29/48  soft-tissue]
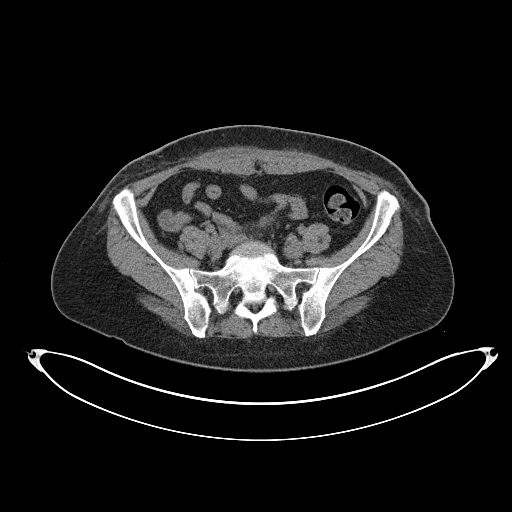
[im 33/48  soft-tissue]
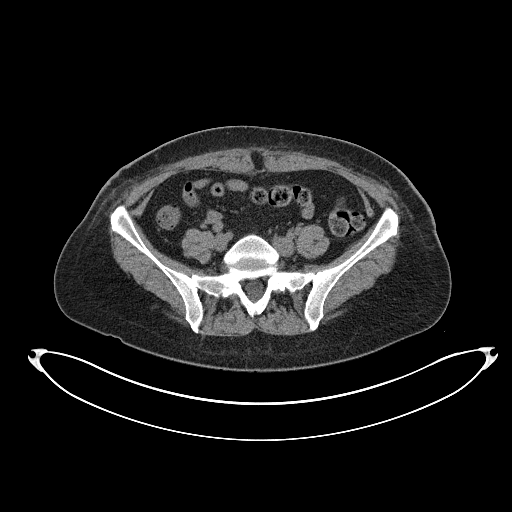
[im 33/48  lung]
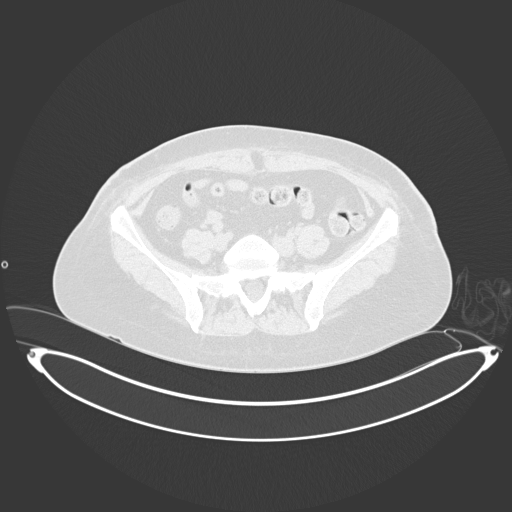
[im 33/48  bone]
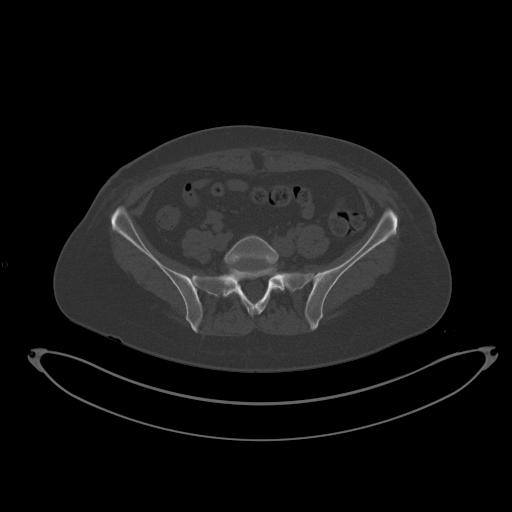
[im 37/48  soft-tissue]
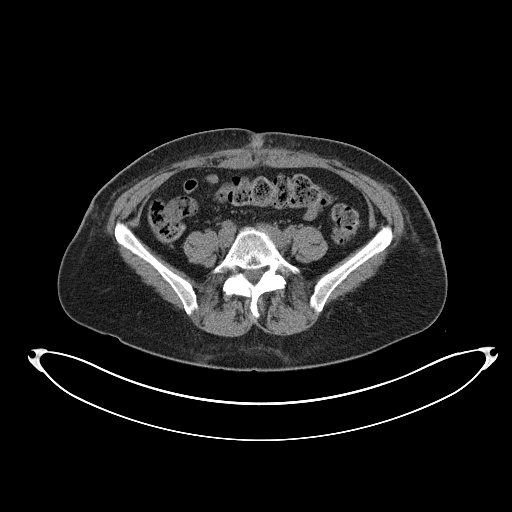
[im 37/48  lung]
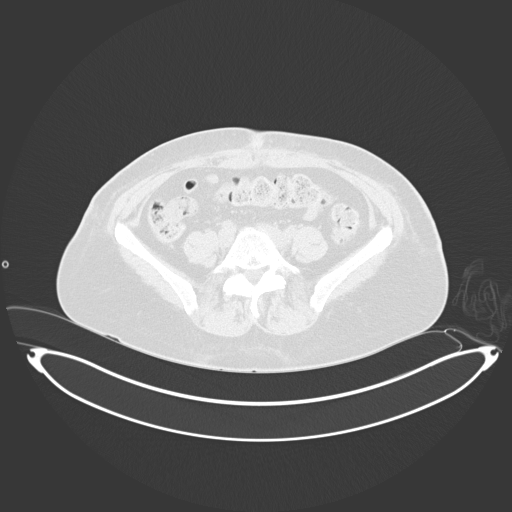
[im 40/48  soft-tissue]
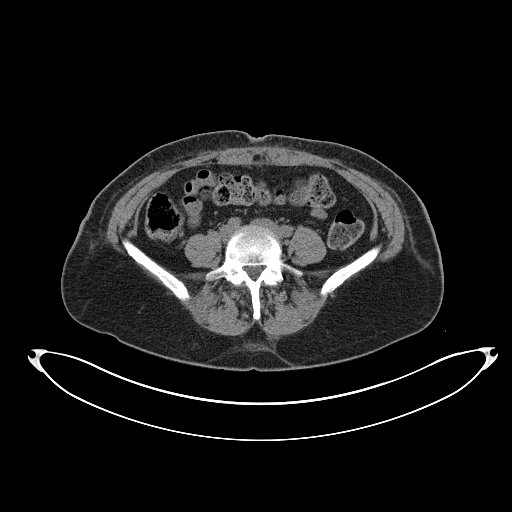
[im 40/48  lung]
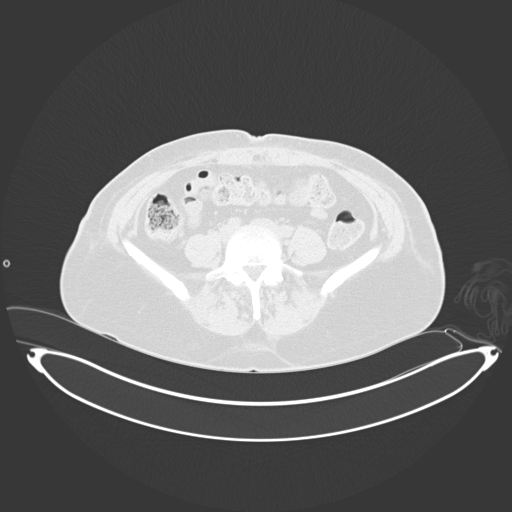
[im 44/48  soft-tissue]
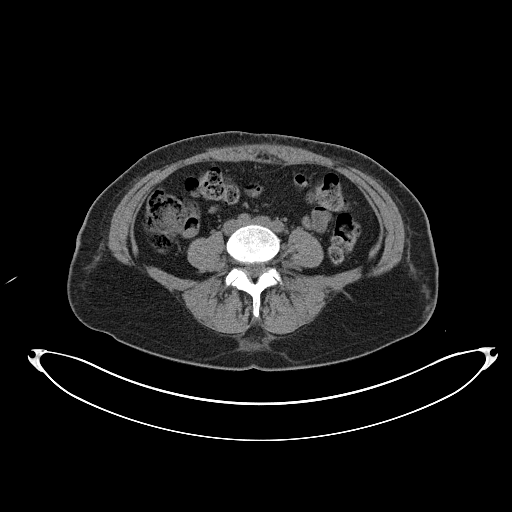
[im 44/48  lung]
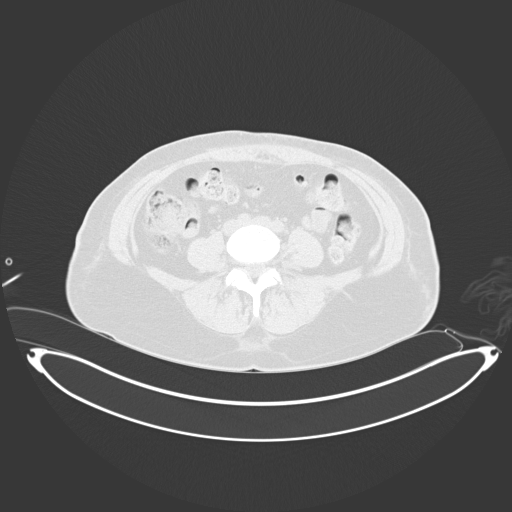

[12 of 32 positions shown; findings below may reference images not displayed]

EXAM:
CT CORE BIOPSY RENAL

MEDICATIONS:
The patient is currently admitted to the hospital and receiving
intravenous antibiotics. The antibiotics were administered within an
appropriate time frame prior to the initiation of the procedure.

ANESTHESIA/SEDATION:
Fentanyl 50 mcg IV; Versed 1 mg IV

Moderate Sedation Time:  13

The patient was continuously monitored during the procedure by the
interventional radiology nurse under my direct supervision.

COMPLICATIONS:
None immediate.

PROCEDURE:
Informed written consent was obtained from the patient after a
thorough discussion of the procedural risks, benefits and
alternatives. All questions were addressed. Maximal Sterile Barrier
Technique was utilized including caps, mask, sterile gowns, sterile
gloves, sterile drape, hand hygiene and skin antiseptic. A timeout
was performed prior to the initiation of the procedure.

The right lower abdomen was prepped and draped in a sterile fashion.
1% lidocaine was utilized for local anesthesia. Ancef was given
preoperatively.

Under CT guidance, an 18 gauge needle was inserted into the seroma
and removed over an Amplatz wire. A 10 French dilator followed by a
10 French drain were inserted. 30 cc serosanguineous fluid was
aspirated.
FINDINGS: Initial imaging demonstrates a very thin fluid collection between
the anterior abdominal wall and subcutaneous fat of the lower
abdomen below the umbilicus.

Subsequent imaging demonstrates placement of a 10 French drain into
the fluid collection. After drain placement, there was resolution of
the fluid collection. It was sent for culture.
IMPRESSION: The patient has a anterior abdominal wall seroma that has been
aspirated multiple times. A drain was placed and will be removed
next week. If the fluid collection does not recur, no further
treatment is necessary. If the fluid collection recurrs,
sclerotherapy can be considered. The patient will need follow-up
next week for drain removal and a subsequent clinic visit in 2
subsequent weeks with ultrasound to evaluate for recurrence of the
fluid collection.

## 2017-07-24 IMAGING — US US ABDOMEN LIMITED
1 series · 14 of 19 positions shown · non-contrast
Comparison: 05/12/2016

CLINICAL DATA: Postop abdominal wall seroma, status post
percutaneous drain and alcohol sclerotherapy.

EXAM:
LIMITED ABDOMINAL ULTRASOUND

[Series 1: us abdomen limited · 0.06mm/px · 14 of 19 slices shown]
[im 1/19]
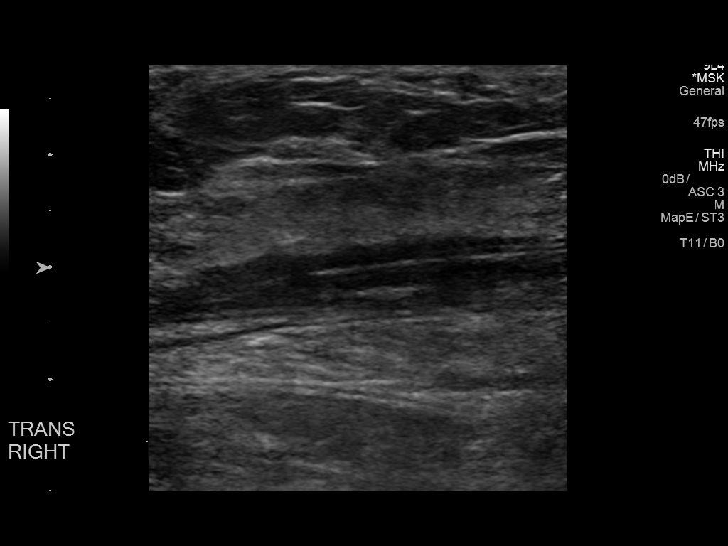
[im 3/19]
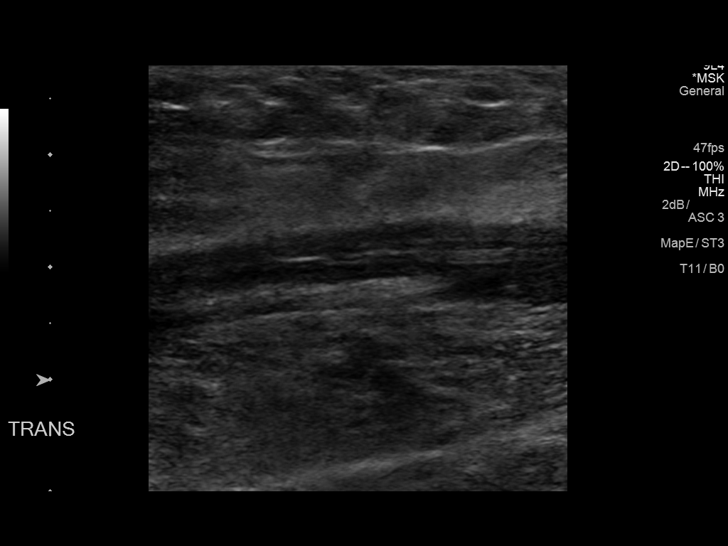
[im 4/19]
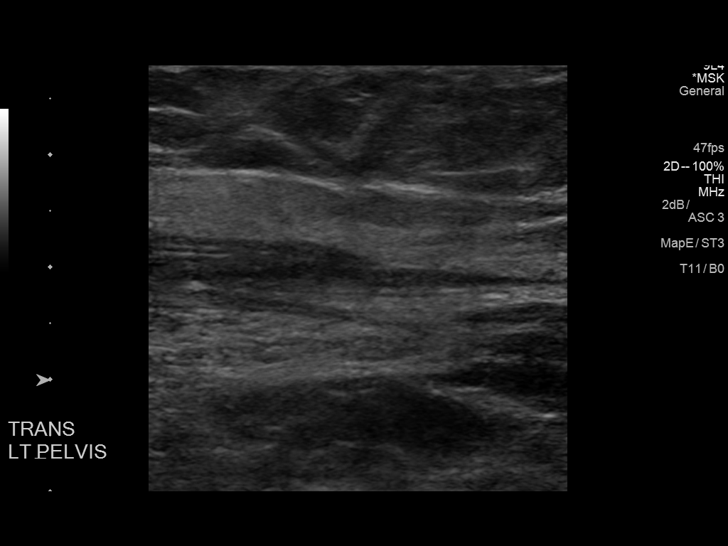
[im 5/19]
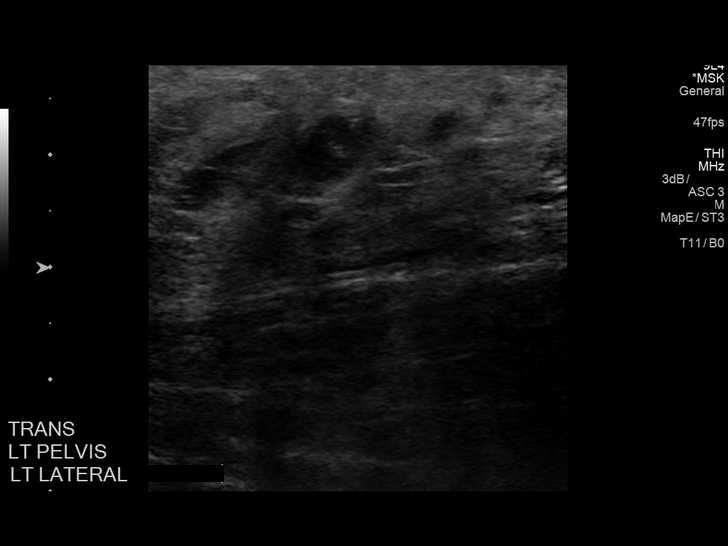
[im 7/19]
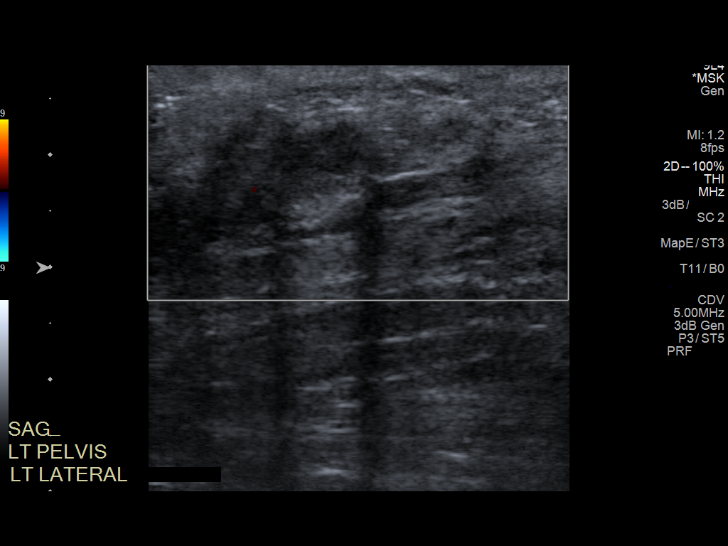
[im 8/19]
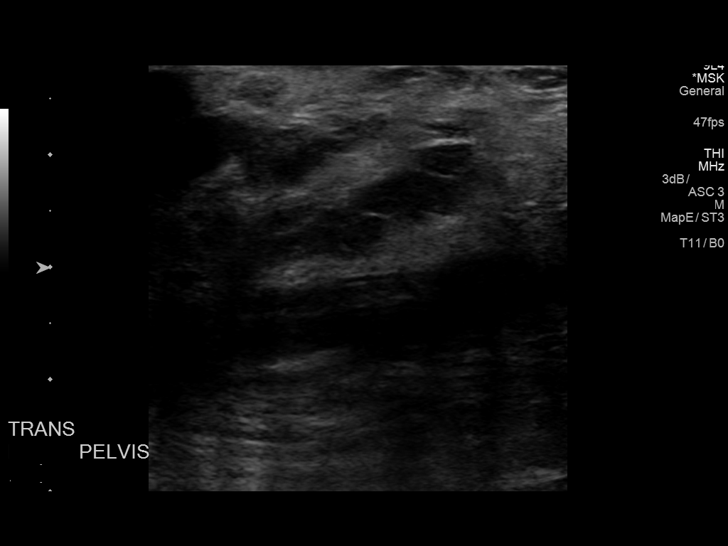
[im 9/19]
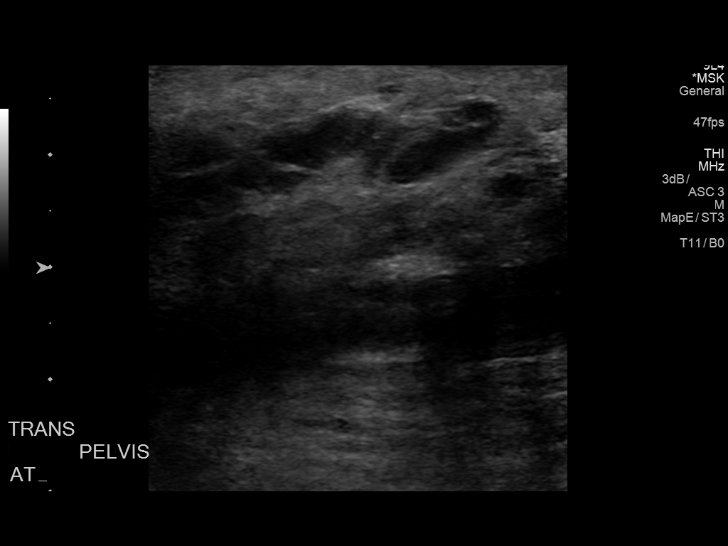
[im 11/19]
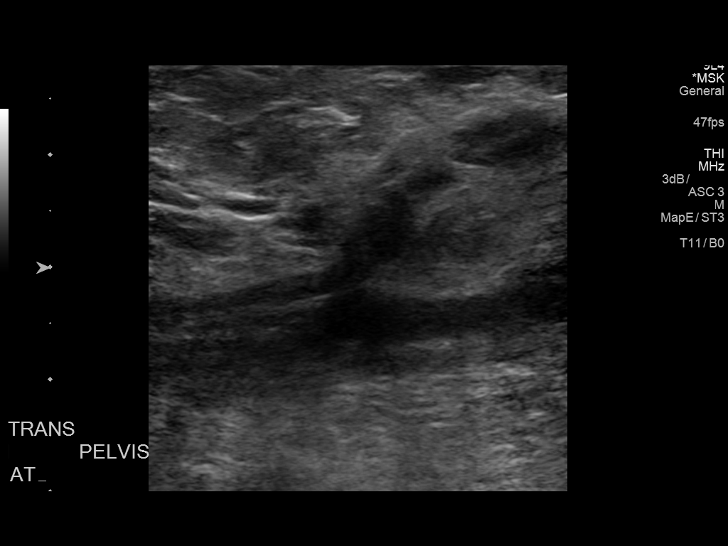
[im 12/19]
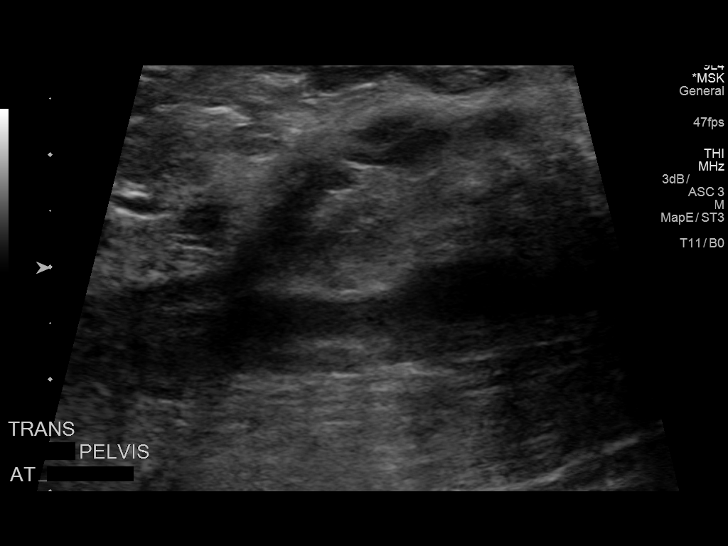
[im 13/19]
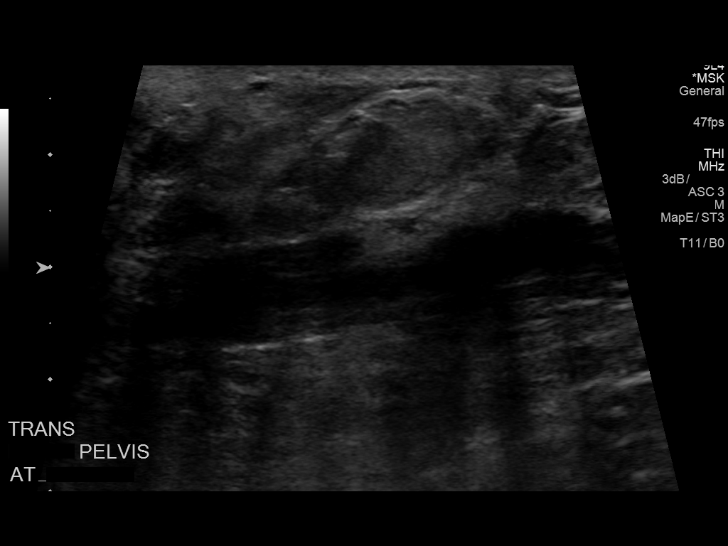
[im 15/19]
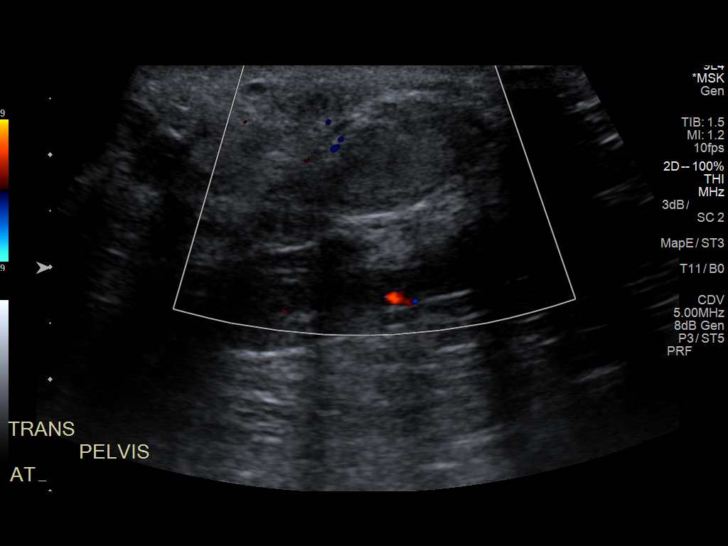
[im 16/19]
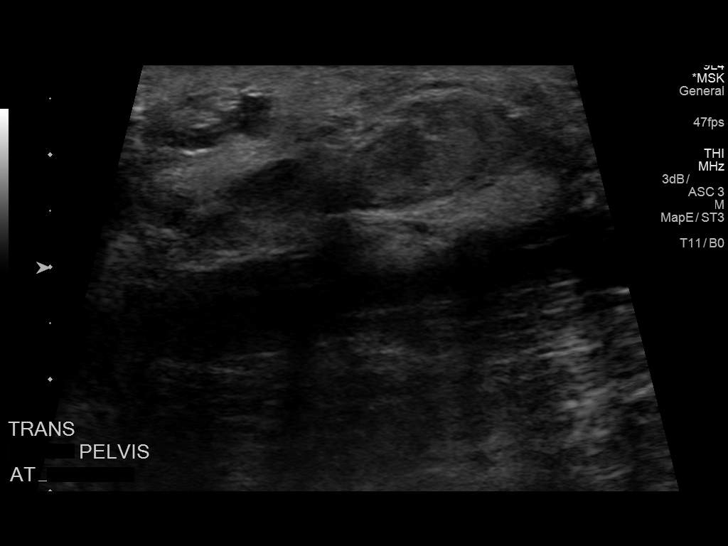
[im 17/19]
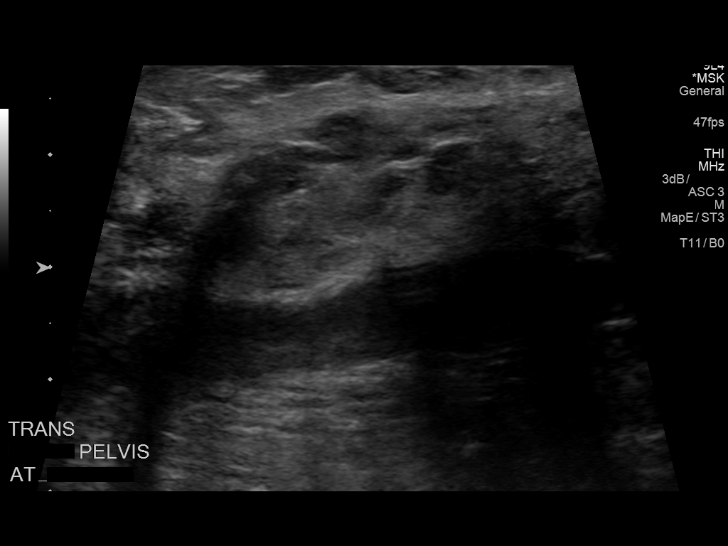
[im 19/19]
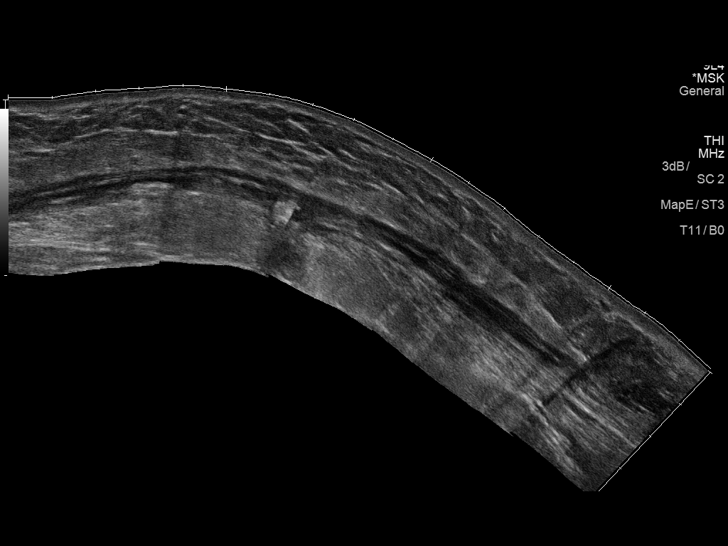

[14 of 19 positions shown; findings below may reference images not displayed]

FINDINGS: Limited ultrasound performed of the abdominal wall at the previous
seroma site. Drain catheter is within the collapsed cavity. No
residual fluid collection. Postop changes of the lower abdomen from
the abdominal surgery.
IMPRESSION: Stable drain catheter position.  Resolved abdominal wall seroma.
